# Patient Record
Sex: Male | Born: 1985 | Race: Black or African American | Hispanic: No | Marital: Single | Smoking: Never smoker
Health system: Southern US, Community
[De-identification: ages and names within clinical notes are randomized; demographics above are authoritative.]

## PROBLEM LIST (undated history)

## (undated) DIAGNOSIS — I10 Essential (primary) hypertension: Secondary | ICD-10-CM

---

## 2016-08-26 ENCOUNTER — Encounter (HOSPITAL_COMMUNITY): Payer: Self-pay | Admitting: Emergency Medicine

## 2016-08-26 ENCOUNTER — Ambulatory Visit (HOSPITAL_COMMUNITY)
Admission: EM | Admit: 2016-08-26 | Discharge: 2016-08-26 | Disposition: A | Discharge status: Home / Self Care | Payer: 59 | Attending: Family Medicine | Admitting: Family Medicine

## 2016-08-26 DIAGNOSIS — M778 Other enthesopathies, not elsewhere classified: Secondary | ICD-10-CM | POA: Diagnosis not present

## 2016-08-26 DIAGNOSIS — G5602 Carpal tunnel syndrome, left upper limb: Secondary | ICD-10-CM

## 2016-08-26 NOTE — ED Triage Notes (Signed)
Here for left hand/fingers numbness and swelling onset 3 days  Denies inj/trauma, pain  A&O x4... NAD

## 2016-08-26 NOTE — ED Provider Notes (Signed)
CSN: [id number]     Arrival date & time 08/26/16  1237 History   First MD Initiated Contact with Patient 08/26/16 1252     Chief Complaint  Patient presents with  . 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 This started about 3-4 days ago. He states his job entails grasping several objects such as the handle of a cart, Brooms, mops and other devices for cleaning.      History reviewed. No pertinent past medical history. History reviewed. No pertinent surgical history. History reviewed. No pertinent family history. Social History  Substance Use Topics  . Smoking status: Never Smoker  . Smokeless tobacco: Never Used  . Alcohol use No    Review of Systems  Constitutional: Negative.   HENT: Negative.   Respiratory: Negative.   Gastrointestinal: Negative.   Genitourinary: Negative.   Musculoskeletal:       As per HPI Points to the tips of his second third and fifth digits as to the location of intermittent numbness.  Skin: Negative.   Neurological: Positive for numbness. Negative for dizziness, weakness and headaches.  All other systems reviewed and are negative.   Allergies  Patient has no known allergies.  Home Medications   Prior to Admission medications   Not on File   Meds Ordered and Administered this Visit  Medications - No data to display  BP 135/80 (BP Location: Left Arm)   Pulse 93   Temp 98.4 F (36.9 C) (Oral)   Resp 18   SpO2 97%  No data found.   Physical Exam  Constitutional: He is oriented to person, place, and time. He appears well-developed and well-nourished.  HENT:  Head: Normocephalic and atraumatic.  Eyes: EOM are normal. Left eye exhibits no discharge.  Neck: Neck supple.  Cardiovascular:  Normal rate.   Pulmonary/Chest: Effort normal.  Musculoskeletal: Normal range of motion. He exhibits no edema or deformity.  Slight decrease and sensitivity to the pads of the second third and fourth digits. Strength and grip is 5 over 5. Full thumb opposition. Wrist extension and flexion are fully intact. Negative Tinel's, positive Phalen's. No observable edema. Distal neurovascular motor sensory otherwise grossly  intact  Neurological: He is alert and oriented to person, place, and time. No cranial nerve deficit.  Skin: Skin is warm and dry. Capillary refill takes less than 2 seconds. No erythema.  Psychiatric: He has a normal mood and affect.  Nursing note and vitals reviewed.   Urgent Care Course     Procedures (including critical care time)  Labs Review Labs Reviewed - No data to display  Imaging Review No results found.   Visual Acuity Review  Right Eye Distance:   Left Eye Distance:   Bilateral Distance:    Right Eye Near:   Left Eye Near:    Bilateral Near:         MDM   1. 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 May remove it for washing hands, etc. May also remove it at home but do not perform any work with your hand/wrist without using the wrist. Recommend using it for at least 2 weeks. Apply cold compresses after work, and for work if possible. May take ibuprofen if  you develop aggravating discomfort. If you are not improving in 2-3 weeks consider calling the hand surgeon listed on this page for appointment.     Hayden Rasmussen, NP 08/26/16 1314

## 2016-08-26 NOTE — Discharge Instructions (Signed)
Wear the wrist splint while working at all times. May remove it for washing hands, etc. May also remove it at home but do not perform any work with your hand/wrist without using the wrist. Recommend using it for at least 2 weeks. Apply cold compresses after work, and for work if possible. May take ibuprofen if you develop aggravating discomfort. If you are not improving in 2-3 weeks consider calling the hand surgeon listed on this page for appointment.

## 2018-07-26 ENCOUNTER — Encounter (HOSPITAL_COMMUNITY): Payer: Self-pay | Admitting: Emergency Medicine

## 2018-07-26 ENCOUNTER — Ambulatory Visit (HOSPITAL_COMMUNITY)
Admission: EM | Admit: 2018-07-26 | Discharge: 2018-07-26 | Disposition: A | Discharge status: Home / Self Care | Payer: Self-pay | Attending: Family Medicine | Admitting: Family Medicine

## 2018-07-26 DIAGNOSIS — B9789 Other viral agents as the cause of diseases classified elsewhere: Secondary | ICD-10-CM

## 2018-07-26 DIAGNOSIS — J069 Acute upper respiratory infection, unspecified: Secondary | ICD-10-CM

## 2018-07-26 NOTE — ED Triage Notes (Signed)
Pt c/o cold symptoms x5 days, coughing sneezing, runny nose.

## 2018-07-26 NOTE — ED Provider Notes (Signed)
  [facility]   [id number] 07/26/18 Arrival Time: 1519  ASSESSMENT & PLAN:  1. Viral URI with cough    Prefers OTC cold medications. Declines Rx cough medication. Discussed typical duration of symptoms. OTC symptom care as needed. Ensure adequate fluid intake and rest. May f/u with PCP or here as needed.  Reviewed expectations re: course of current medical issues. Questions answered. Outlined signs and symptoms indicating need for more acute intervention. Patient verbalized understanding. After Visit Summary given.   SUBJECTIVE: History from: patient.  Louis King is a 33 y.o. male who presents with complaint of nasal congestion, post-nasal drainage, sinus pressure, and a persistent dry cough; without sore throat. Onset abrupt, 4-5 d ago; with fatigue and with body aches. SOB: none. Wheezing: none. Fever: no. Overall normal PO intake without n/v. Known sick contacts: no. No specific or significant aggravating or alleviating factors reported. OTC treatment: anti-histamine with mild relief.    Social History   Tobacco Use  Smoking Status Never Smoker  Smokeless Tobacco Never Used    ROS: As per HPI.   OBJECTIVE:  Vitals:   07/26/18 1605  BP: 134/89  Pulse: 90  Resp: 16  Temp: 98.2 F (36.8 C)  SpO2: 97%     General appearance: alert; appears fatigued HEENT: nasal congestion; clear runny nose; throat irritation secondary to post-nasal drainage Neck: supple without LAD CV: RRR Lungs: unlabored respirations, symmetrical air entry without wheezing; cough: mild Abd: soft Ext: no LE edema Skin: warm and dry Psychological: alert and cooperative; normal mood and affect    No Known Allergies   Social History   Socioeconomic History  . Marital status: Single    Spouse name: Not on file  . Number of children: Not on file  . Years of education: Not on file  . Highest education level: Not on file  Occupational History  . Not on file  Social Needs   . Financial resource strain: Not on file  . Food insecurity:    Worry: Not on file    Inability: Not on file  . Transportation needs:    Medical: Not on file    Non-medical: Not on file  Tobacco Use  . Smoking status: Never Smoker  . Smokeless tobacco: Never Used  Substance and Sexual Activity  . Alcohol use: No  . Drug use: Not on file  . Sexual activity: Not on file  Lifestyle  . Physical activity:    Days per week: Not on file    Minutes per session: Not on file  . Stress: Not on file  Relationships  . Social connections:    Talks on phone: Not on file    Gets together: Not on file    Attends religious service: Not on file    Active member of club or organization: Not on file    Attends meetings of clubs or organizations: Not on file    Relationship status: Not on file  . Intimate partner violence:    Fear of current or ex partner: Not on file    Emotionally abused: Not on file    Physically abused: Not on file    Forced sexual activity: Not on file  Other Topics Concern  . Not on file  Social History Narrative  . Not on file           Mardella Layman, MD 07/31/18 [phone]

## 2019-04-22 ENCOUNTER — Encounter (HOSPITAL_COMMUNITY): Payer: Self-pay | Admitting: Emergency Medicine

## 2019-04-22 ENCOUNTER — Emergency Department (HOSPITAL_COMMUNITY)
Admission: EM | Admit: 2019-04-22 | Discharge: 2019-04-23 | Disposition: A | Discharge status: Home / Self Care | Payer: BC Managed Care – PPO | Attending: Emergency Medicine | Admitting: Emergency Medicine

## 2019-04-22 ENCOUNTER — Other Ambulatory Visit: Payer: Self-pay

## 2019-04-22 DIAGNOSIS — Z20828 Contact with and (suspected) exposure to other viral communicable diseases: Secondary | ICD-10-CM | POA: Diagnosis not present

## 2019-04-22 DIAGNOSIS — R1031 Right lower quadrant pain: Secondary | ICD-10-CM | POA: Diagnosis present

## 2019-04-22 DIAGNOSIS — R109 Unspecified abdominal pain: Secondary | ICD-10-CM

## 2019-04-22 NOTE — ED Triage Notes (Signed)
Patient reports right flank pain radiating to right lateral abdomen onset Thursday this week , denies injury , no hematuria or dysuria , patient stated heavy lifting at work . Pain increases when sneezing or coughing .

## 2019-04-23 ENCOUNTER — Emergency Department (HOSPITAL_COMMUNITY): Payer: BC Managed Care – PPO

## 2019-04-23 LAB — COMPREHENSIVE METABOLIC PANEL
ALT: 34 U/L (ref 0–44)
AST: 20 U/L (ref 15–41)
Albumin: 3.8 g/dL (ref 3.5–5.0)
Alkaline Phosphatase: 99 U/L (ref 38–126)
Anion gap: 8 (ref 5–15)
BUN: 10 mg/dL (ref 6–20)
CO2: 27 mmol/L (ref 22–32)
Calcium: 9.2 mg/dL (ref 8.9–10.3)
Chloride: 101 mmol/L (ref 98–111)
Creatinine, Ser: 1.14 mg/dL (ref 0.61–1.24)
GFR calc Af Amer: >60 mL/min (ref >60)
GFR calc non Af Amer: >60 mL/min (ref >60)
Glucose, Bld: 126 mg/dL — ABNORMAL HIGH (ref 70–99)
Potassium: 4 mmol/L (ref 3.5–5.1)
Sodium: 136 mmol/L (ref 135–145)
Total Bilirubin: 0.1 mg/dL — ABNORMAL LOW (ref 0.3–1.2)
Total Protein: 7.2 g/dL (ref 6.5–8.1)

## 2019-04-23 LAB — URINALYSIS, ROUTINE W REFLEX MICROSCOPIC
Bacteria, UA: NONE SEEN
Bilirubin Urine: NEGATIVE
Glucose, UA: NEGATIVE mg/dL
Hgb urine dipstick: NEGATIVE
Ketones, ur: NEGATIVE mg/dL
Leukocytes,Ua: NEGATIVE
Nitrite: NEGATIVE
Protein, ur: 30 mg/dL — AB
Specific Gravity, Urine: 1.026 (ref 1.005–1.030)
pH: 5 (ref 5.0–8.0)

## 2019-04-23 LAB — CBC WITH DIFFERENTIAL/PLATELET
Abs Immature Granulocytes: 0.02 10*3/uL (ref 0.00–0.07)
Basophils Absolute: 0 10*3/uL (ref 0.0–0.1)
Basophils Relative: 0 %
Eosinophils Absolute: 0.1 10*3/uL (ref 0.0–0.5)
Eosinophils Relative: 1 %
HCT: 41.1 % (ref 39.0–52.0)
Hemoglobin: 13.1 g/dL (ref 13.0–17.0)
Immature Granulocytes: 0 %
Lymphocytes Relative: 26 %
Lymphs Abs: 2.3 10*3/uL (ref 0.7–4.0)
MCH: 26.2 pg (ref 26.0–34.0)
MCHC: 31.9 g/dL (ref 30.0–36.0)
MCV: 82.2 fL (ref 80.0–100.0)
Monocytes Absolute: 0.6 10*3/uL (ref 0.1–1.0)
Monocytes Relative: 7 %
Neutro Abs: 5.6 10*3/uL (ref 1.7–7.7)
Neutrophils Relative %: 66 %
Platelets: 360 10*3/uL (ref 150–400)
RBC: 5 MIL/uL (ref 4.22–5.81)
RDW: 14.6 % (ref 11.5–15.5)
WBC: 8.6 10*3/uL (ref 4.0–10.5)
nRBC: 0 % (ref 0.0–0.2)

## 2019-04-23 LAB — SARS CORONAVIRUS 2 (TAT 6-24 HRS): SARS Coronavirus 2: NEGATIVE

## 2019-04-23 LAB — POC SARS CORONAVIRUS 2 AG -  ED: SARS Coronavirus 2 Ag: NEGATIVE

## 2019-04-23 LAB — D-DIMER, QUANTITATIVE: D-Dimer, Quant: 0.59 ug/mL-FEU — ABNORMAL HIGH (ref 0.00–0.50)

## 2019-04-23 MED ORDER — KETOROLAC TROMETHAMINE 30 MG/ML IJ SOLN
30.0000 mg | Freq: Once | INTRAMUSCULAR | Status: AC
  Administered 2019-04-23: 30 mg via INTRAVENOUS
  Filled 2019-04-23: qty 1

## 2019-04-23 MED ORDER — IOHEXOL 350 MG/ML SOLN
100.0000 mL | Freq: Once | INTRAVENOUS | Status: AC
  Administered 2019-04-23: 100 mL via INTRAVENOUS

## 2019-04-23 MED ORDER — IBUPROFEN 800 MG PO TABS: 800.0000 mg | ORAL_TABLET | Freq: Three times a day (TID) | ORAL | 0 refills | Status: DC | PRN

## 2019-04-23 NOTE — ED Notes (Signed)
Pt was holding an SPO2 of 90-92 percent on room air. Pt was advising feeling fine but had not been able to speak in full sentences. Pt pulse noted to be 66 BPM manual.

## 2019-04-23 NOTE — Discharge Instructions (Addendum)
You may alternate Tylenol 1000 mg every 6 hours as needed for pain, fever and Ibuprofen 800 mg every 8 hours as needed for pain, fever.  Please take Ibuprofen with food.  Do not take more than 4000 mg of Tylenol (acetaminophen) in a 24 hour period.    Steps to find a Primary Care Provider (PCP):  Call [phone] or [phone] to access "[facility] Find a Doctor Service."  2.  You may also go on the [facility] website at [email or web address]  3.  [facility] also frequently accepts new patients.  [facility]  [address] [place] [address] [phone]  4.  There are also multiple [facility], [facility] and [facility] practices throughout the [place] that are frequently accepting new patients. You may find a clinic that is close to your home and contact them.  [email or web address] [phone]  [facility] [email or web address]  [email or web address] [phone]  [email or web address] [phone]  5.  Local Health Departments also can provide primary care services.  [facility]  [address] [place] [address] [phone]  [facility] [address] [place] [address] [phone]  [facility] [address]  [place] [address] [phone]   

## 2019-04-23 NOTE — ED Notes (Signed)
Patient transported to CT 

## 2019-04-23 NOTE — ED Provider Notes (Signed)
I received the patient in signout from Dr. Leonides Schanz.  Briefly the patient is a 33 year old male with flank pain.  He felt this was similar to prior kidney stone that he had had.  He was noted to be hypoxic transiently in the ED.  Was ambulatory without hypoxia.  And plan for CT angiogram of the chest evaluate for pulmonary embolism and stone study.  Both of these returned and are negative.  The patient does have some concern for atherosclerosis.  I discussed this with the patient.  We will have him follow-up with a PCP.  Return precautions given.   Deno Etienne, DO 04/23/19 [phone]

## 2019-04-23 NOTE — ED Provider Notes (Signed)
TIME SEEN: 4:56 AM  CHIEF COMPLAINT: Right flank pain  HPI: Patient is a 33 year old male with history of obesity who presents to the emergency department with complaints of several days of right flank pain.  Pain started after he was lifting a heavy box at work and is worse with coughing, sneezing, having a bowel movement, twisting, turning, bending.  He was seen at urgent care who thought this was musculoskeletal and started him on prednisone and cyclobenzaprine.  States he took 1 dose of cyclobenzaprine and felt "loopy" so he stopped this medication.  He denies any history of kidney stones but states tonight pain significantly worsened where he thought he was going to pass out.  States he became diaphoretic and thought he was going to throw up.  He had nausea but no vomiting.  He has taken Tylenol once.  No Ibuprofen.  In the ED it was noted that patient is intermittently hypoxic with sats in the 80s.  He is sitting upright, awake and talking during these episodes.  He denies chest pain or shortness of breath.  No history of PE or DVT.  No lower extremity swelling or pain.  No fevers or cough.  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  No known Covid exposures.  ROS: See HPI Constitutional: no fever  Eyes: no drainage  ENT: no runny nose   Cardiovascular:  no chest pain  Resp: no SOB  GI: no vomiting GU: no dysuria Integumentary: no rash  Allergy: no hives  Musculoskeletal: no leg swelling  Neurological: no slurred speech ROS otherwise negative  PAST MEDICAL HISTORY/PAST SURGICAL HISTORY:  History reviewed. No pertinent past medical history.  MEDICATIONS:  Prior to Admission medications   Medication Sig Start Date End Date Taking? Authorizing Provider  acetaminophen (TYLENOL) 650 MG CR tablet Take 650 mg by mouth every 8 (eight) hours as needed for pain.   Yes [provider]  cyclobenzaprine (FLEXERIL) 10 MG tablet Take 10 mg by mouth at bedtime. 04/19/19   [provider]  predniSONE (DELTASONE) 20 MG tablet Take 40 mg by mouth daily. 04/19/19   [provider]    ALLERGIES:  No Known Allergies  SOCIAL HISTORY:  Social History   Tobacco Use  . Smoking status: Never Smoker  . Smokeless tobacco: Never Used  Substance Use Topics  . Alcohol use: No    FAMILY HISTORY: No family history on file.  EXAM: BP (!) 155/98 (BP Location: Left Arm)   Pulse 88   Temp 98.4 F (36.9 C) (Oral)   Resp 16   SpO2 95%  CONSTITUTIONAL: Alert and oriented and responds appropriately to questions. Well-appearing; well-nourished HEAD: Normocephalic EYES: Conjunctivae clear, pupils appear equal, EOM appear intact ENT: normal nose; moist mucous membranes NECK: Supple, normal ROM CARD: RRR; S1 and S2 appreciated; no murmurs, no clicks, no rubs, no gallops RESP: Normal chest excursion without splinting or tachypnea; breath sounds clear and equal bilaterally; no wheezes, no rhonchi, no rales, no hypoxia or respiratory distress, speaking full sentences ABD/GI: Normal bowel sounds; non-distended; soft, non-tender, no rebound, no guarding, no peritoneal signs, no hepatosplenomegaly BACK: No midline spinal tenderness, step-off or deformity.  Tender to palpation over the right lateral ribs without redness, warmth, ecchymosis, swelling, rash. EXT: Normal ROM in all joints; no deformity noted, no edema; no cyanosis SKIN: Normal color for age and race; warm; no rash on exposed skin NEURO: Moves all extremities equally, reports normal sensation diffusely, normal gait PSYCH: The patient's  mood and manner are appropriate.   MEDICAL DECISION MAKING: Patient here with flank pain.  Initially symptoms seem very consistent with musculoskeletal pain but while in the room evaluating the patient it was noted that he had intermittent episodes of hypoxia.  I am concerned now that this could be pneumonia, COVID-19, PE causing this right lower lateral chest/flank pain.  I do not think  this is a kidney stone although this seems to be his biggest concern.  His urine appears normal today without signs of infection or blood.  Will obtain EKG, D-dimer, chest x-ray.  Otherwise labs are unremarkable.  Will provide with Toradol for pain control.  Will swab for COVID 19 as well.  ED PROGRESS: Patient's D-dimer is slightly elevated.  Will obtain CTA of the chest as well as a CT noncontrast of the abdomen pelvis to evaluate for PE as well as kidney stone.  With ambulation, patient's oxygen saturation was in the low 90s.  His Covid antigen swab is negative.  Will send outpatient PCR Covid swab.  Toradol helped significantly with patient's pain.  I recommended alternating Tylenol and Motrin for further pain control.  7:15 AM  Pt's CT scans pending.  Signed out to Dr. Tyrone Nine to follow-up on CT imaging.  I reviewed all nursing notes and pertinent previous records as available.  I have interpreted any EKGs, lab and urine results, imaging (as available).   EKG Interpretation  Date/Time:  Monday April 23 2019 05:42:47 EST Ventricular Rate:  78 PR Interval:    QRS Duration: 110 QT Interval:  389 QTC Calculation: 444 R Axis:   32 Text Interpretation: Sinus rhythm RSR' in V1 or V2, probably normal variant Borderline repolarization abnormality ST elevation, consider anterolateral injury No old tracing to compare Confirmed by Ward, Cyril Mourning [phone]) on 04/23/2019 5:46:51 AM         Louis King was evaluated in Emergency Department on 04/23/2019 for the symptoms described in the history of present illness. He was evaluated in the context of the global COVID-19 pandemic, which necessitated consideration that the patient might be at risk for infection with the SARS-CoV-2 virus that causes COVID-19. Institutional protocols and algorithms that pertain to the evaluation of patients at risk for COVID-19 are in a state of rapid change based on information released by regulatory bodies including the CDC  and federal and state organizations. These policies and algorithms were followed during the patient's care in the ED.  Patient was seen wearing N95, face shield, gloves.    Ward, Delice Bison, DO 04/23/19 [phone]

## 2020-02-12 ENCOUNTER — Ambulatory Visit: Payer: Self-pay | Attending: Internal Medicine

## 2020-02-12 ENCOUNTER — Other Ambulatory Visit (HOSPITAL_BASED_OUTPATIENT_CLINIC_OR_DEPARTMENT_OTHER): Payer: Self-pay | Admitting: Internal Medicine

## 2020-02-12 DIAGNOSIS — Z23 Encounter for immunization: Secondary | ICD-10-CM

## 2020-02-12 NOTE — Progress Notes (Signed)
   Covid-19 Vaccination Clinic  Name:  Louis King    MRN: [id number] DOB: [birth year]  02/12/2020  [name] was observed post Covid-19 immunization for 15 minutes without incident. He was provided with Vaccine Information Sheet and instruction to access the V-Safe system.  Vaccinated by Gladis Riffle  [name] was instructed to call 911 with any severe reactions post vaccine: Marland Kitchen Difficulty breathing  . Swelling of face and throat  . A fast heartbeat  . A bad rash all over body  . Dizziness and weakness   Immunizations Administered    Name Date Dose VIS Date Route   Pfizer COVID-19 Vaccine 02/12/2020 11:51 AM 0.3 mL 07/11/2018 Intramuscular   Manufacturer: [employer], [employer]   Lot: [id number]   NDC: [id number]

## 2020-02-15 MED FILL — PFIZER-BIONTECH COVID-19 VA: 30 | 1 days supply | Qty: 0 | Fill #0

## 2020-03-04 ENCOUNTER — Other Ambulatory Visit (HOSPITAL_BASED_OUTPATIENT_CLINIC_OR_DEPARTMENT_OTHER): Payer: Self-pay | Admitting: Internal Medicine

## 2020-03-04 ENCOUNTER — Ambulatory Visit: Payer: Self-pay | Attending: Internal Medicine

## 2020-03-04 DIAGNOSIS — Z23 Encounter for immunization: Secondary | ICD-10-CM

## 2020-03-04 NOTE — Progress Notes (Signed)
   Covid-19 Vaccination Clinic  Name:  Tashi Band    MRN: [id number] DOB: 11-29-1985  03/04/2020  Mr. Nellums was observed post Covid-19 immunization for 15 minutes without incident. He was provided with Vaccine Information Sheet and instruction to access the V-Safe system.   Mr. Pavlak was instructed to call 911 with any severe reactions post vaccine: Marland Kitchen Difficulty breathing  . Swelling of face and throat  . A fast heartbeat  . A bad rash all over body  . Dizziness and weakness   Immunizations Administered    Name Date Dose VIS Date Route   Pfizer COVID-19 Vaccine 03/04/2020 12:43 PM 0.3 mL 07/11/2018 Intramuscular   Manufacturer: [employer], [employer]   Lot: [id number]   NDC: [address]-5

## 2020-03-11 MED FILL — PFIZER-BIONTECH COVID-19 VA: 30 | 1 days supply | Qty: 0 | Fill #0

## 2021-10-21 IMAGING — CT CT RENAL STONE PROTOCOL
2 of 4 series · 17 of 46 positions shown, 19 images · non-contrast
Comparison: None.

CLINICAL DATA: Right lateral abdominal pain. Felt something pop on
[REDACTED] with worsening.

EXAM:
CT ABDOMEN AND PELVIS WITHOUT CONTRAST
TECHNIQUE: Multidetector CT imaging of the abdomen and pelvis was performed
following the standard protocol without IV contrast.

[Series 5: ap without · axial · non-contrast · 0.96mm/px · z∈[-497,-77]mm · 14 of 96 slices shown, 16 images]
[im 6/96  soft-tissue]
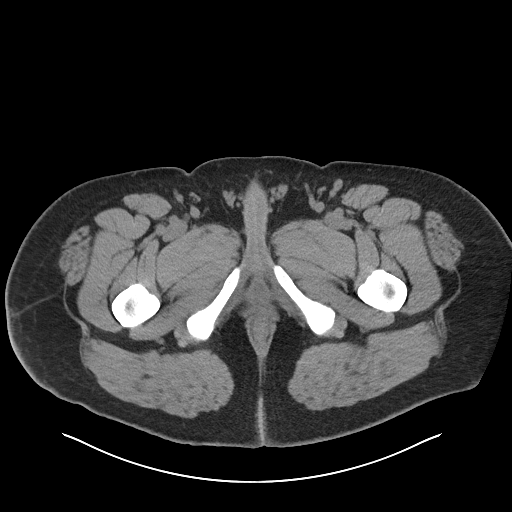
[im 6/96  bone]
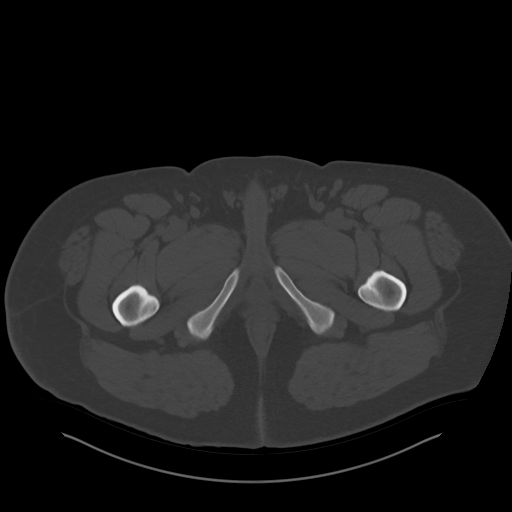
[im 12/96  soft-tissue]
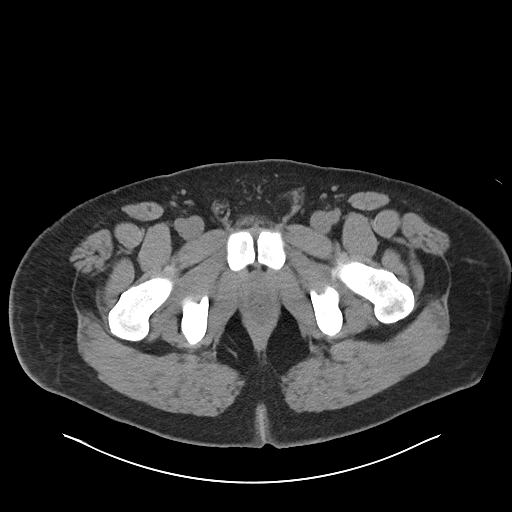
[im 17/96  soft-tissue]
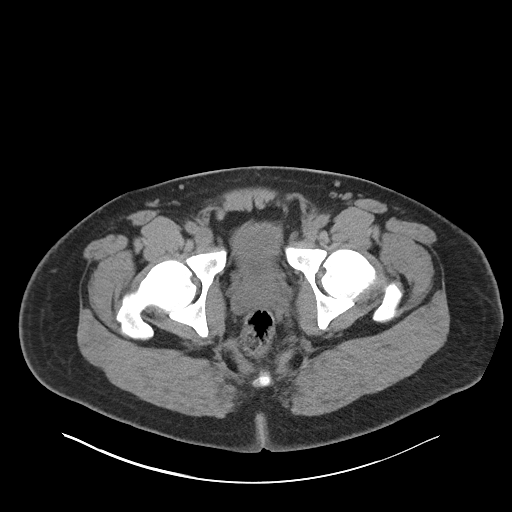
[im 28/96  soft-tissue]
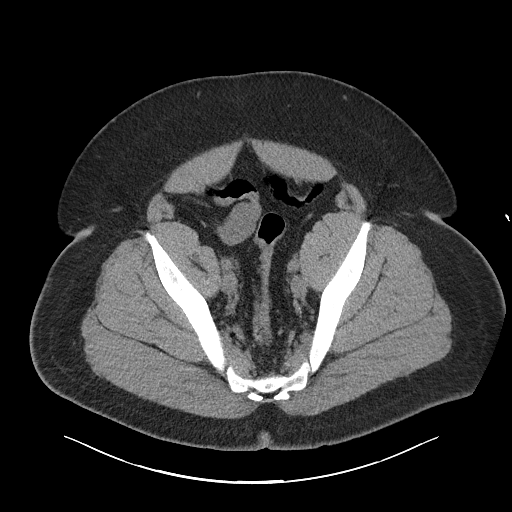
[im 34/96  soft-tissue]
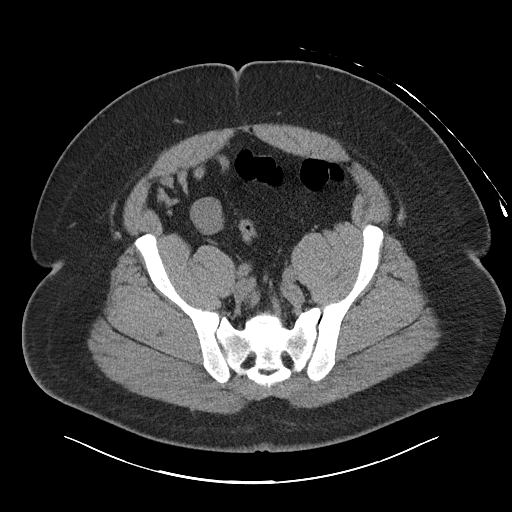
[im 40/96  soft-tissue]
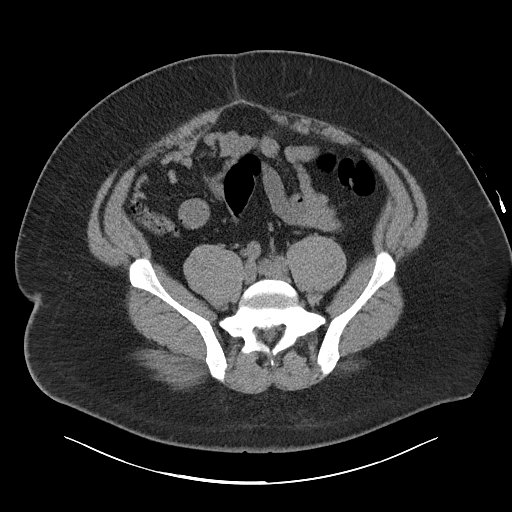
[im 45/96  soft-tissue]
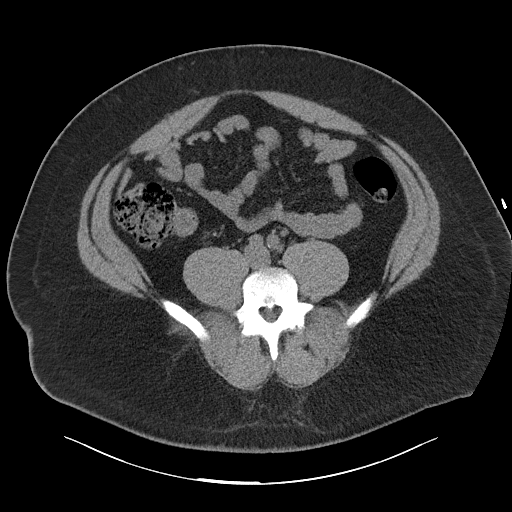
[im 51/96  soft-tissue]
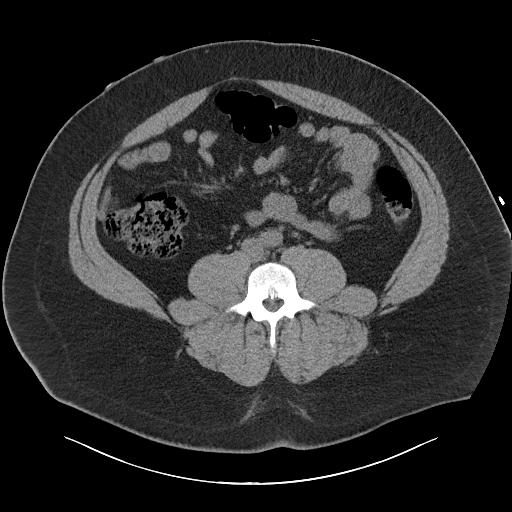
[im 56/96  soft-tissue]
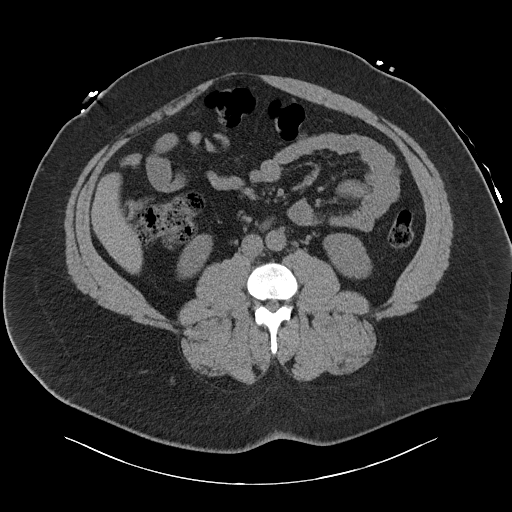
[im 56/96  bone]
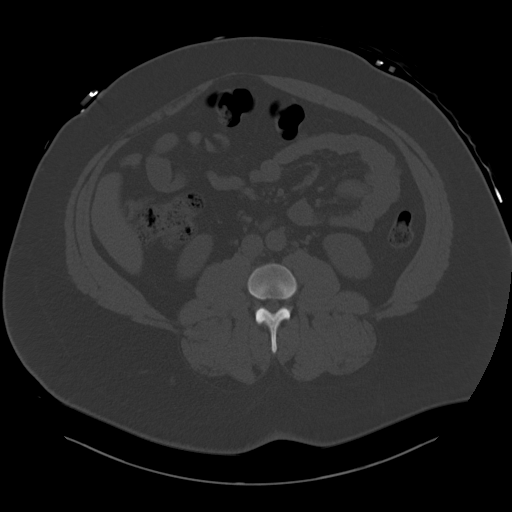
[im 62/96  soft-tissue]
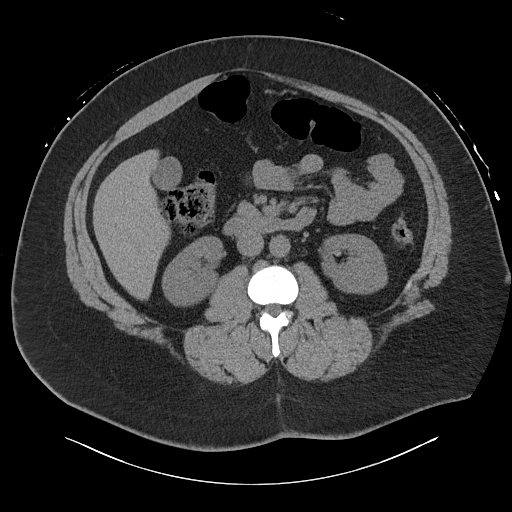
[im 73/96  soft-tissue]
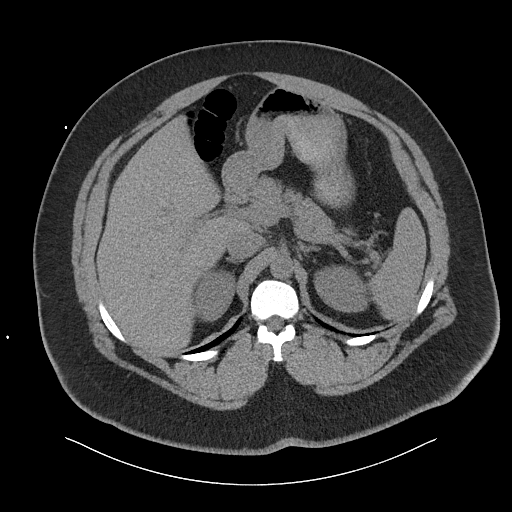
[im 79/96  soft-tissue]
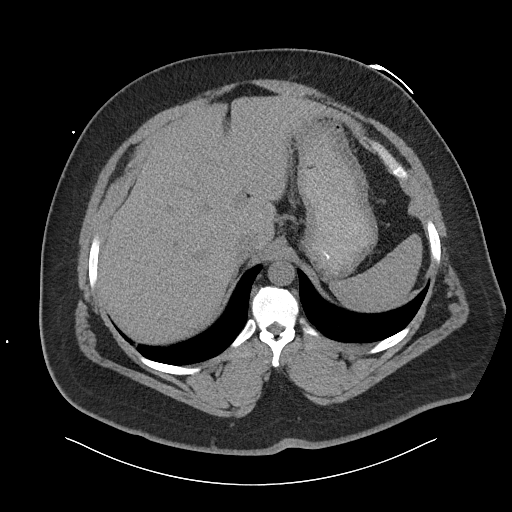
[im 84/96  soft-tissue]
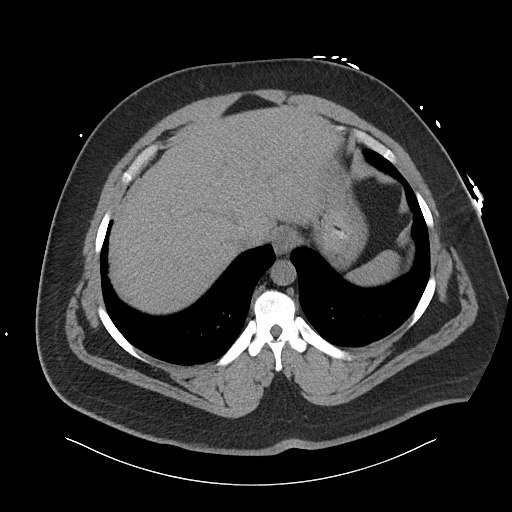
[im 90/96  soft-tissue]
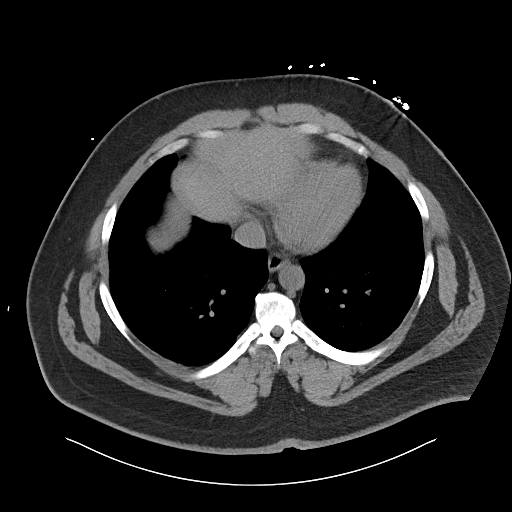

[Series 8: cor · coronal · 0.97mm/px · 3 of 144 slices shown]
[im 48/144  soft-tissue]
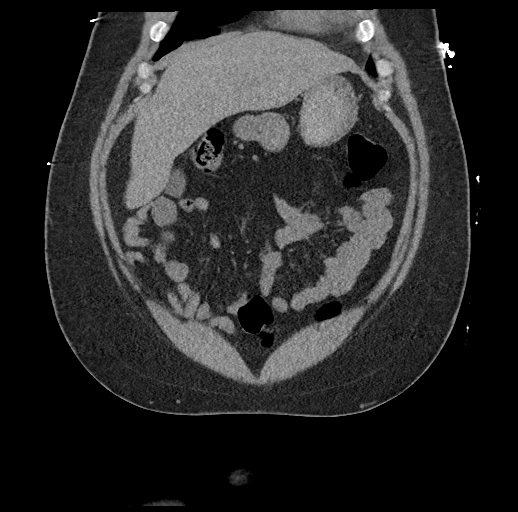
[im 64/144  soft-tissue]
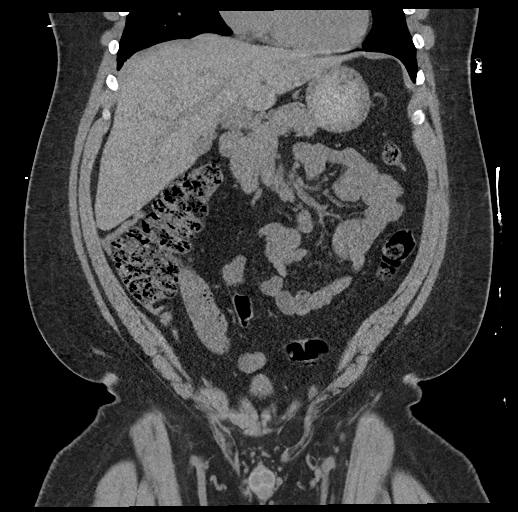
[im 80/144  soft-tissue]
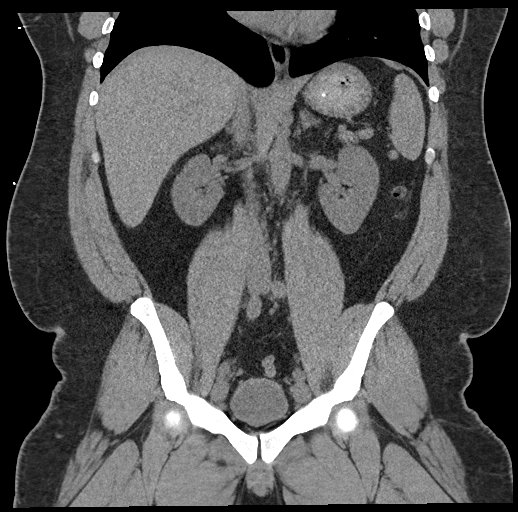

[17 of 46 positions shown; findings below may reference images not displayed]

FINDINGS: Lower chest:  No contributory findings.

Hepatobiliary: No focal liver abnormality.No evidence of biliary
obstruction or stone.

Pancreas: Unremarkable.

Spleen: Unremarkable.

Adrenals/Urinary Tract: Negative adrenals. No hydronephrosis or
stone. Unremarkable bladder.

Stomach/Bowel:  No obstruction. No appendicitis.

Vascular/Lymphatic: No acute vascular abnormality. Aortic
atherosclerotic calcification. No mass or adenopathy.

Reproductive:No pathologic findings.

Other: No ascites or pneumoperitoneum. Shallow fatty left inguinal
hernia.

Musculoskeletal: No acute abnormalities. Transitional S1 vertebra.
Advanced L5-S1 osteoarthritis on the left. L4-5 shallow right
paracentral protrusion with mild calcification.
IMPRESSION: 1. No acute finding.  No hydronephrosis or urolithiasis.
2. Atherosclerotic calcification.

## 2023-01-15 ENCOUNTER — Emergency Department (HOSPITAL_COMMUNITY)
Admission: EM | Admit: 2023-01-15 | Discharge: 2023-01-15 | Disposition: A | Discharge status: Home / Self Care | Payer: 59 | Attending: Emergency Medicine | Admitting: Emergency Medicine

## 2023-01-15 ENCOUNTER — Other Ambulatory Visit: Payer: Self-pay

## 2023-01-15 ENCOUNTER — Encounter (HOSPITAL_COMMUNITY): Payer: Self-pay

## 2023-01-15 ENCOUNTER — Emergency Department (HOSPITAL_COMMUNITY): Payer: 59

## 2023-01-15 DIAGNOSIS — F410 Panic disorder [episodic paroxysmal anxiety] without agoraphobia: Secondary | ICD-10-CM

## 2023-01-15 DIAGNOSIS — F419 Anxiety disorder, unspecified: Secondary | ICD-10-CM | POA: Diagnosis not present

## 2023-01-15 DIAGNOSIS — R55 Syncope and collapse: Secondary | ICD-10-CM | POA: Insufficient documentation

## 2023-01-15 LAB — CBC WITH DIFFERENTIAL/PLATELET
Abs Immature Granulocytes: 0.02 10*3/uL (ref 0.00–0.07)
Basophils Absolute: 0 10*3/uL (ref 0.0–0.1)
Basophils Relative: 1 %
Eosinophils Absolute: 0 10*3/uL (ref 0.0–0.5)
Eosinophils Relative: 0 %
HCT: 42.5 % (ref 39.0–52.0)
Hemoglobin: 14 g/dL (ref 13.0–17.0)
Immature Granulocytes: 0 %
Lymphocytes Relative: 21 %
Lymphs Abs: 1.3 10*3/uL (ref 0.7–4.0)
MCH: 27.4 pg (ref 26.0–34.0)
MCHC: 32.9 g/dL (ref 30.0–36.0)
MCV: 83.2 fL (ref 80.0–100.0)
Monocytes Absolute: 0.4 10*3/uL (ref 0.1–1.0)
Monocytes Relative: 5 %
Neutro Abs: 4.7 10*3/uL (ref 1.7–7.7)
Neutrophils Relative %: 73 %
Platelets: 324 10*3/uL (ref 150–400)
RBC: 5.11 MIL/uL (ref 4.22–5.81)
RDW: 14.1 % (ref 11.5–15.5)
WBC: 6.5 10*3/uL (ref 4.0–10.5)
nRBC: 0 % (ref 0.0–0.2)

## 2023-01-15 LAB — BASIC METABOLIC PANEL
Anion gap: 10 (ref 5–15)
BUN: 11 mg/dL (ref 6–20)
CO2: 23 mmol/L (ref 22–32)
Calcium: 9.3 mg/dL (ref 8.9–10.3)
Chloride: 103 mmol/L (ref 98–111)
Creatinine, Ser: 1.27 mg/dL — ABNORMAL HIGH (ref 0.61–1.24)
GFR, Estimated: >60 mL/min (ref >60)
Glucose, Bld: 104 mg/dL — ABNORMAL HIGH (ref 70–99)
Potassium: 3.9 mmol/L (ref 3.5–5.1)
Sodium: 136 mmol/L (ref 135–145)

## 2023-01-15 LAB — TROPONIN I (HIGH SENSITIVITY)
Troponin I (High Sensitivity): 21 ng/L — ABNORMAL HIGH (ref <18)
Troponin I (High Sensitivity): 25 ng/L — ABNORMAL HIGH (ref <18)

## 2023-01-15 MED ORDER — ACETAMINOPHEN 500 MG PO TABS
1000.0000 mg | ORAL_TABLET | Freq: Once | ORAL | Status: AC
  Administered 2023-01-15: 1000 mg via ORAL
  Filled 2023-01-15: qty 2

## 2023-01-15 MED ORDER — LORAZEPAM 1 MG PO TABS
1.0000 mg | ORAL_TABLET | Freq: Once | ORAL | Status: AC
  Administered 2023-01-15: 1 mg via ORAL
  Filled 2023-01-15: qty 1

## 2023-01-15 NOTE — ED Provider Notes (Signed)
[facility] Provider Note   CSN: [id number] Arrival date & time: 01/15/23  1332     History  Chief Complaint  Patient presents with   Panic Attack    Louis King is a 37 y.o. male.  This is a 37 year old male who presents emergency department today after he had a syncopal episode.  Per EMS, the patient was informed that he had a 79 year old son, he then became very anxious, was breathing very quickly, lost consciousness and fell down striking his head.        Home Medications Prior to Admission medications   Medication Sig Start Date End Date Taking? Authorizing Provider  acetaminophen (TYLENOL) 650 MG CR tablet Take 650 mg by mouth 2 (two) times daily as needed for pain.     [provider]  Camphor-Menthol-Methyl Sal (TIGER BALM MUSCLE RUB) 07-19-13 % CREA Apply 1 application topically at bedtime as needed (for muscle pain).    [provider]  cyclobenzaprine (FLEXERIL) 10 MG tablet Take 10 mg by mouth at bedtime. 04/19/19   [provider]  ibuprofen (ADVIL) 800 MG tablet Take 1 tablet (800 mg total) by mouth every 8 (eight) hours as needed for mild pain. 04/23/19   Ward, Layla Maw, DO      Allergies    Flexeril [cyclobenzaprine]    Review of Systems   Review of Systems  Physical Exam Updated Vital Signs BP (!) 148/112   Pulse (!) 102   Temp 97.8 F (36.6 C) (Oral)   Resp (!) 26   SpO2 100%  Physical Exam Constitutional:      Appearance: Normal appearance.  HENT:     Head: Normocephalic and atraumatic.     Right Ear: Tympanic membrane normal.     Left Ear: Tympanic membrane normal.     Nose: Nose normal.     Mouth/Throat:     Mouth: Mucous membranes are moist.  Eyes:     Pupils: Pupils are equal, round, and reactive to light.  Neck:     Comments: Right-sided paraspinal muscle tenderness Cardiovascular:     Pulses: Normal pulses.     Heart sounds: Normal heart sounds.  Pulmonary:      Effort: Pulmonary effort is normal.     Breath sounds: Normal breath sounds.  Abdominal:     Palpations: Abdomen is soft.  Musculoskeletal:        General: Normal range of motion.     Cervical back: Normal range of motion and neck supple. No rigidity.  Skin:    General: Skin is warm and dry.  Neurological:     General: No focal deficit present.     Mental Status: He is alert and oriented to person, place, and time.     Cranial Nerves: No cranial nerve deficit.     Motor: No weakness.     Gait: Gait normal.     ED Results / Procedures / Treatments   Labs (all labs ordered are listed, but only abnormal results are displayed) Labs Reviewed  BASIC METABOLIC PANEL  CBC WITH DIFFERENTIAL/PLATELET  TROPONIN I (HIGH SENSITIVITY)    EKG None  Radiology No results found.  Procedures Procedures    Medications Ordered in ED Medications  LORazepam (ATIVAN) tablet 1 mg (has no administration in time range)    ED Course/ Medical Decision Making/ A&P  Medical Decision Making 37 year old male presents to the emergency department after a syncopal episode.  Differential diagnoses include syncope, less likely vasovagal syncope, panic attack, acute stress reaction.  Plan- sounds like the patient had an acute stress reaction after discovering that he had a 36 year old son.  Patient with a reassuring physical exam.  He is endorsing some chest pain, she will get an EKG and a troponin.  Imaging of the patient's head and neck ordered.  Will provide the patient with some p.o. Ativan.  Reassessment-patient feeling quite a bit better at this time.  His initial troponin was elevated 21, second was obtained that was 25.  This is not a significant delta.  I repeated an EKG on the patient which did not show any changes.  Patient not experiencing chest pain or shortness of breath.  Patient without significant cardiac risk factors.  Will discharge patient  home.  Amount and/or Complexity of Data Reviewed Labs: ordered. Radiology: ordered.  Risk OTC drugs. Prescription drug management.           Final Clinical Impression(s) / ED Diagnoses Final diagnoses:  None    Rx / DC Orders ED Discharge Orders     None         Arletha Pili, DO 01/15/23 2102

## 2023-01-15 NOTE — ED Triage Notes (Signed)
Pt BIB [facility] from home. Pt has really bad anxiety. Pt is not on any medications. Pt found out he has another son with another woman last week after 13 years which triggered his anxiety. Pt passed out from hyperventilating. Hit his head on the floor. Pt c/o R side head pain. No apparent injuries noted. A&O X4.  C-collar applied by EMS.  BP 170/100 HR 120s SPO2 98

## 2023-01-15 NOTE — Discharge Instructions (Signed)
While you are in the emergency department, your EKGs done that were normal.  I believe that your symptoms are experienced today were due to stress.  Return to the emergency department if you develop pain in your chest or difficulty breathing.

## 2023-10-22 ENCOUNTER — Emergency Department (HOSPITAL_COMMUNITY)

## 2023-10-22 ENCOUNTER — Observation Stay (HOSPITAL_COMMUNITY)
Admission: EM | Admit: 2023-10-22 | Discharge: 2023-10-23 | Disposition: A | Discharge status: Home / Self Care | Attending: Cardiovascular Disease | Admitting: Cardiovascular Disease

## 2023-10-22 ENCOUNTER — Encounter (HOSPITAL_COMMUNITY): Payer: Self-pay | Admitting: *Deleted

## 2023-10-22 ENCOUNTER — Other Ambulatory Visit: Payer: Self-pay

## 2023-10-22 DIAGNOSIS — Z6841 Body Mass Index (BMI) 40.0 and over, adult: Secondary | ICD-10-CM | POA: Diagnosis not present

## 2023-10-22 DIAGNOSIS — E785 Hyperlipidemia, unspecified: Secondary | ICD-10-CM | POA: Insufficient documentation

## 2023-10-22 DIAGNOSIS — I351 Nonrheumatic aortic (valve) insufficiency: Secondary | ICD-10-CM | POA: Insufficient documentation

## 2023-10-22 DIAGNOSIS — Z7982 Long term (current) use of aspirin: Secondary | ICD-10-CM | POA: Insufficient documentation

## 2023-10-22 DIAGNOSIS — I340 Nonrheumatic mitral (valve) insufficiency: Secondary | ICD-10-CM | POA: Insufficient documentation

## 2023-10-22 DIAGNOSIS — I361 Nonrheumatic tricuspid (valve) insufficiency: Secondary | ICD-10-CM | POA: Diagnosis not present

## 2023-10-22 DIAGNOSIS — R55 Syncope and collapse: Secondary | ICD-10-CM | POA: Insufficient documentation

## 2023-10-22 DIAGNOSIS — I371 Nonrheumatic pulmonary valve insufficiency: Secondary | ICD-10-CM | POA: Insufficient documentation

## 2023-10-22 DIAGNOSIS — R079 Chest pain, unspecified: Secondary | ICD-10-CM

## 2023-10-22 DIAGNOSIS — I119 Hypertensive heart disease without heart failure: Secondary | ICD-10-CM | POA: Insufficient documentation

## 2023-10-22 DIAGNOSIS — I422 Other hypertrophic cardiomyopathy: Secondary | ICD-10-CM | POA: Insufficient documentation

## 2023-10-22 DIAGNOSIS — I249 Acute ischemic heart disease, unspecified: Secondary | ICD-10-CM | POA: Diagnosis not present

## 2023-10-22 DIAGNOSIS — F419 Anxiety disorder, unspecified: Secondary | ICD-10-CM | POA: Insufficient documentation

## 2023-10-22 HISTORY — DX: Essential (primary) hypertension: I10

## 2023-10-22 LAB — BASIC METABOLIC PANEL WITH GFR
Anion gap: 11 (ref 5–15)
BUN: 12 mg/dL (ref 6–20)
CO2: 22 mmol/L (ref 22–32)
Calcium: 9.5 mg/dL (ref 8.9–10.3)
Chloride: 103 mmol/L (ref 98–111)
Creatinine, Ser: 1.1 mg/dL (ref 0.61–1.24)
GFR, Estimated: >60 mL/min (ref >60)
Glucose, Bld: 103 mg/dL — ABNORMAL HIGH (ref 70–99)
Potassium: 3.8 mmol/L (ref 3.5–5.1)
Sodium: 136 mmol/L (ref 135–145)

## 2023-10-22 LAB — CBC
HCT: 48.1 % (ref 39.0–52.0)
Hemoglobin: 15.3 g/dL (ref 13.0–17.0)
MCH: 26.2 pg (ref 26.0–34.0)
MCHC: 31.8 g/dL (ref 30.0–36.0)
MCV: 82.4 fL (ref 80.0–100.0)
Platelets: 319 10*3/uL (ref 150–400)
RBC: 5.84 MIL/uL — ABNORMAL HIGH (ref 4.22–5.81)
RDW: 14.4 % (ref 11.5–15.5)
WBC: 5.7 10*3/uL (ref 4.0–10.5)
nRBC: 0 % (ref 0.0–0.2)

## 2023-10-22 LAB — TROPONIN I (HIGH SENSITIVITY)
Troponin I (High Sensitivity): 21 ng/L — ABNORMAL HIGH (ref <18)
Troponin I (High Sensitivity): 24 ng/L — ABNORMAL HIGH (ref <18)

## 2023-10-22 MED ORDER — SODIUM CHLORIDE 0.9% FLUSH
3.0000 mL | Freq: Two times a day (BID) | INTRAVENOUS | Status: DC
  Administered 2023-10-22 – 2023-10-23 (×2): 3 mL via INTRAVENOUS

## 2023-10-22 MED ORDER — METOPROLOL SUCCINATE ER 25 MG PO TB24
25.0000 mg | ORAL_TABLET | Freq: Every day | ORAL | Status: DC
  Administered 2023-10-22 – 2023-10-23 (×2): 25 mg via ORAL
  Filled 2023-10-22 (×2): qty 1

## 2023-10-22 MED ORDER — SODIUM CHLORIDE 0.9% FLUSH: 3.0000 mL | INTRAVENOUS | Status: DC | PRN

## 2023-10-22 MED ORDER — ONDANSETRON HCL 4 MG/2ML IJ SOLN: 4.0000 mg | Freq: Four times a day (QID) | INTRAMUSCULAR | Status: DC | PRN

## 2023-10-22 MED ORDER — ASPIRIN 81 MG PO TBEC
81.0000 mg | DELAYED_RELEASE_TABLET | Freq: Every day | ORAL | Status: DC
  Administered 2023-10-23: 81 mg via ORAL
  Filled 2023-10-22: qty 1

## 2023-10-22 MED ORDER — SODIUM CHLORIDE 0.9 % IV SOLN: 250.0000 mL | INTRAVENOUS | Status: DC | PRN

## 2023-10-22 MED ORDER — HEPARIN BOLUS VIA INFUSION
4000.0000 [IU] | Freq: Once | INTRAVENOUS | Status: AC
  Administered 2023-10-22: 4000 [IU] via INTRAVENOUS
  Filled 2023-10-22: qty 4000

## 2023-10-22 MED ORDER — ACETAMINOPHEN 325 MG PO TABS
650.0000 mg | ORAL_TABLET | ORAL | Status: DC | PRN
Start: 2023-10-22 — End: 2023-10-23
  Administered 2023-10-22: 650 mg via ORAL
  Filled 2023-10-22: qty 2

## 2023-10-22 MED ORDER — LOSARTAN POTASSIUM 50 MG PO TABS
25.0000 mg | ORAL_TABLET | Freq: Every day | ORAL | Status: DC
  Administered 2023-10-22 – 2023-10-23 (×2): 25 mg via ORAL
  Filled 2023-10-22 (×2): qty 1

## 2023-10-22 MED ORDER — ASPIRIN 300 MG RE SUPP: 300.0000 mg | RECTAL | Status: AC

## 2023-10-22 MED ORDER — NITROGLYCERIN 0.4 MG SL SUBL: 0.4000 mg | SUBLINGUAL_TABLET | SUBLINGUAL | Status: DC | PRN

## 2023-10-22 MED ORDER — IOHEXOL 350 MG/ML SOLN
75.0000 mL | Freq: Once | INTRAVENOUS | Status: AC | PRN
  Administered 2023-10-22: 75 mL via INTRAVENOUS

## 2023-10-22 MED ORDER — ATORVASTATIN CALCIUM 40 MG PO TABS
40.0000 mg | ORAL_TABLET | Freq: Every day | ORAL | Status: DC
Start: 2023-10-22 — End: 2023-10-23
  Administered 2023-10-22 – 2023-10-23 (×2): 40 mg via ORAL
  Filled 2023-10-22 (×2): qty 1

## 2023-10-22 MED ORDER — HEPARIN (PORCINE) 25000 UT/250ML-% IV SOLN
1750.0000 [IU]/h | INTRAVENOUS | Status: DC
  Administered 2023-10-22: 1500 [IU]/h via INTRAVENOUS
  Administered 2023-10-23: 1700 [IU]/h via INTRAVENOUS
  Filled 2023-10-22 (×2): qty 250

## 2023-10-22 MED ORDER — ASPIRIN 81 MG PO CHEW
324.0000 mg | CHEWABLE_TABLET | ORAL | Status: AC
  Administered 2023-10-22: 324 mg via ORAL
  Filled 2023-10-22: qty 4

## 2023-10-22 NOTE — Progress Notes (Signed)
 ANTICOAGULATION CONSULT NOTE  Pharmacy Consult for Heparin Indication: chest pain/ACS  Allergies  Allergen Reactions   Flexeril [Cyclobenzaprine] Other (See Comments)    Caused lethargy    Patient Measurements: Height: 5\' 11"  (180.3 cm) Weight: (!) 147.4 kg (325 lb) IBW/kg (Calculated) : 75.3 Heparin Dosing Weight: 110.1 kg  Vital Signs: Temp: 99.1 F (37.3 C) (06/07 1525) BP: 147/80 (06/07 1800) Pulse Rate: 86 (06/07 1730)  Labs: Recent Labs    10/22/23 1552 10/22/23 1823  HGB 15.3  --   HCT 48.1  --   PLT 319  --   CREATININE 1.10  --   TROPONINIHS 21* 24*    Estimated Creatinine Clearance: 134.1 mL/min (by C-G formula based on SCr of 1.1 mg/dL).   Medical History: Past Medical History:  Diagnosis Date   Hypertension     Medications:  (Not in a hospital admission)  Scheduled:   aspirin  324 mg Oral NOW   Or   aspirin  300 mg Rectal NOW   [START ON 10/23/2023] aspirin EC  81 mg Oral Daily   atorvastatin  40 mg Oral Daily   losartan  25 mg Oral Daily   metoprolol succinate  25 mg Oral Daily   sodium chloride flush  3 mL Intravenous Q12H   Infusions:   sodium chloride     PRN: sodium chloride, acetaminophen , nitroGLYCERIN, ondansetron (ZOFRAN) IV, sodium chloride flush  Assessment: 37 yom with a history of HTN, obesity. Patient is presenting with chest pain. Heparin per pharmacy consult placed for chest pain/ACS.  Patient is not on anticoagulation prior to arrival.  Hgb 15.3; plt 319  Goal of Therapy:  Heparin level 0.3-0.7 units/ml Monitor platelets by anticoagulation protocol: Yes   Plan:  Give IV heparin 4000 units bolus x 1 Start heparin infusion at 1500 units/hr Check anti-Xa level in 6 hours and daily while on heparin Continue to monitor H&H and platelets  Dionicio Fray, PharmD, BCPS 10/22/2023 8:20 PM ED Clinical Pharmacist -  [phone]

## 2023-10-22 NOTE — H&P (Signed)
 Referring Physician: Celesta Coke, MD/Christian Legrand Puma, PA-C  Louis King is an 38 y.o. male.                       Chief Complaint: Chest pain and syncope  HPI: 38 years old black male with PMH of untreated Hypertension (known to the patient for at least 2 years) and Morbid Obesity has episodes of chest pain, shortness of breath, nausea and one episode of syncope. He admits to heavy duty work x 1 week. His EKG shows sinus rhythm with LVH. Troponin I levels are minimally elevated x 2. CBC and BMET are near normal. CXR is unremarkable. CT of abdomen is without acute abnormality.  Past Medical History:  Diagnosis Date   Hypertension       History reviewed. No pertinent surgical history.  History reviewed. No pertinent family history. Social History:  reports that he has never smoked. He has never used smokeless tobacco. He reports that he does not drink alcohol and does not use drugs.  Allergies:  Allergies  Allergen Reactions   Flexeril [Cyclobenzaprine] Other (See Comments)    Caused lethargy    (Not in a hospital admission)   Results for orders placed or performed during the hospital encounter of 10/22/23 (from the past 48 hours)  Basic metabolic panel     Status: Abnormal   Collection Time: 10/22/23  3:52 PM  Result Value Ref Range   Sodium 136 135 - 145 mmol/L   Potassium 3.8 3.5 - 5.1 mmol/L   Chloride 103 98 - 111 mmol/L   CO2 22 22 - 32 mmol/L   Glucose, Bld 103 (H) 70 - 99 mg/dL    Comment: Glucose reference range applies only to samples taken after fasting for at least 8 hours.   BUN 12 6 - 20 mg/dL   Creatinine, Ser 1.61 0.61 - 1.24 mg/dL   Calcium 9.5 8.9 - 10.3 mg/dL   GFR, Estimated >09 >60 mL/min    Comment: (NOTE) Calculated using the CKD-EPI Creatinine Equation (2021)    Anion gap 11 5 - 15    Comment: Performed at [facility], [address] [place], [place] [address]  CBC     Status: Abnormal   Collection Time: 10/22/23  3:52 PM   Result Value Ref Range   WBC 5.7 4.0 - 10.5 K/uL   RBC 5.84 (H) 4.22 - 5.81 MIL/uL   Hemoglobin 15.3 13.0 - 17.0 g/dL   HCT 81.1 91.4 - 78.2 %   MCV 82.4 80.0 - 100.0 fL   MCH 26.2 26.0 - 34.0 pg   MCHC 31.8 30.0 - 36.0 g/dL   RDW 95.6 21.3 - 08.6 %   Platelets 319 150 - 400 K/uL   nRBC 0.0 0.0 - 0.2 %    Comment: Performed at [facility], [address], [place], [place] [address]  Troponin I (High Sensitivity)     Status: Abnormal   Collection Time: 10/22/23  3:52 PM  Result Value Ref Range   Troponin I (High Sensitivity) 21 (H) <18 ng/L    Comment: (NOTE) Elevated high sensitivity troponin I (hsTnI) values and significant  changes across serial measurements may suggest ACS but many other  chronic and acute conditions are known to elevate hsTnI results.  Refer to the "Links" section for chest pain algorithms and additional  guidance. Performed at [facility], [address] [place], [place] [address]   Troponin I (High  Sensitivity)     Status: Abnormal   Collection Time: 10/22/23  6:23 PM  Result Value Ref Range   Troponin I (High Sensitivity) 24 (H) <18 ng/L    Comment: (NOTE) Elevated high sensitivity troponin I (hsTnI) values and significant  changes across serial measurements may suggest ACS but many other  chronic and acute conditions are known to elevate hsTnI results.  Refer to the "Links" section for chest pain algorithms and additional  guidance. Performed at [facility], [address], [place], [place] [address]    CT ABDOMEN PELVIS W CONTRAST Result Date: 10/22/2023 CLINICAL DATA:  Right lower quadrant abdominal pain EXAM: CT ABDOMEN AND PELVIS WITH CONTRAST TECHNIQUE: Multidetector CT imaging of the abdomen and pelvis was performed using the standard protocol following bolus administration of intravenous contrast. RADIATION DOSE REDUCTION: This exam was performed according to the departmental dose-optimization program which includes  automated exposure control, adjustment of the mA and/or kV according to patient size and/or use of iterative reconstruction technique. CONTRAST:  75mL OMNIPAQUE  IOHEXOL  350 MG/ML SOLN COMPARISON:  CT 04/22/2021 FINDINGS: Lower chest: No acute abnormality. Hepatobiliary: Unremarkable liver. Normal gallbladder. No biliary dilation. Pancreas: Unremarkable. Spleen: Unremarkable. Adrenals/Urinary Tract: Normal adrenal glands. No urinary calculi or hydronephrosis. Bladder is unremarkable. Stomach/Bowel: Normal caliber large and small bowel. No bowel wall thickening. The appendix is normal.Stomach is within normal limits. Vascular/Lymphatic: No significant vascular findings are present. No enlarged abdominal or pelvic lymph nodes. Reproductive: Unremarkable. Other: No free intraperitoneal fluid or air. Left greater than right fat containing inguinal hernias. Musculoskeletal: No acute fracture. IMPRESSION: 1. No acute abnormality in the abdomen or pelvis. Normal appendix. 2. Left greater than right fat containing inguinal hernias. Electronically Signed   By: Rozell Cornet M.D.   On: 10/22/2023 18:58   DG Chest 2 View Result Date: 10/22/2023 CLINICAL DATA:  Intermittent chest pain for two weeks. Shortness of breath. Nausea. EXAM: CHEST - 2 VIEW COMPARISON:  04/23/2019 FINDINGS: The heart size and mediastinal contours are within normal limits. Both lungs are clear. The visualized skeletal structures are unremarkable. IMPRESSION: No active cardiopulmonary disease. Electronically Signed   By: Marlyce Sine M.D.   On: 10/22/2023 16:51    Review Of Systems Constitutional: No fever, chills, chronic weight gain. Eyes: No vision change, wears glasses. No discharge or pain. Ears: No hearing loss, No tinnitus. Respiratory: No asthma, COPD, pneumonias. Positive shortness of breath. No hemoptysis. Cardiovascular: Positive chest pain, no palpitation, no leg edema. Gastrointestinal: No nausea, vomiting, diarrhea, constipation.  No GI bleed. No hepatitis. Genitourinary: No dysuria, hematuria, kidney stone. No incontinance. Neurological: No headache, stroke, seizures.  Psychiatry: No psych facility admission for anxiety, depression, suicide. No detox. Skin: No rash. Musculoskeletal: No joint pain, fibromyalgia. No neck pain, back pain. Lymphadenopathy: No lymphadenopathy. Hematology: No anemia or easy bruising.   Blood pressure (!) 147/80, pulse 86, temperature 99.1 F (37.3 C), resp. rate 18, height 5\' 11"  (1.803 m), weight (!) 147.4 kg, SpO2 100%. Body mass index is 45.33 kg/m. General appearance: alert, cooperative, appears stated age and no distress Head: Normocephalic, atraumatic. Eyes: Brown eyes, pink conjunctiva, corneas clear.  Neck: No adenopathy, no carotid bruit, no JVD, supple, symmetrical, trachea midline and thyroid not enlarged. Resp: Clear to auscultation bilaterally. Cardio: Regular rate and rhythm, S1, S2 normal, II/VI systolic murmur, no click, rub or gallop GI: Soft, non-tender; bowel sounds normal; no organomegaly. Extremities: No edema, cyanosis or clubbing. Skin: Warm and dry.  Neurologic: Alert and oriented X  3, normal strength. Normal coordination.  Assessment/Plan Acute coronary syndrome Hypertension R/O HOCM Morbid obesity Syncope  Plan: Place in observation Monitor. Echocardiogram. Stress test followed by cardiac cath if needed as IP or OP.  Time spent: Review of old records, Lab, x-rays, EKG, other cardiac tests, examination, discussion with patient/Family/PA/Nurse over 70 minutes.  Darrold Emms, MD  10/22/2023, 7:56 PM

## 2023-10-22 NOTE — ED Triage Notes (Signed)
 Chest pain sob and nausea fro 1 week  no cardiac history

## 2023-10-22 NOTE — ED Provider Triage Note (Addendum)
 Emergency Medicine Provider Triage Evaluation Note  Louis King , a 38 y.o. male  was evaluated in triage. Complains of CP, shob, nausea, and pain. Sought ed eval today when he started having abd pain and decided to get everything checked out  lower abd pain following sneezing and felt a pop or squeeze. Was 10/10 now a 3/10. No NVD, fevers. Last bm this morning and normal.  intermittent CP one week ago, Started suddenly one week ago with diaphoresis, nausea, shob while at rest. Worsens with exertion. One week ago, has syncopal episode with prodrome. Was on ground on knee when he started feeling poorly and rolled down. Believes he had syncopal episode for 2-3 minutes. Witnessed by 69yr old son.. no fmhx of cardiac hx  Currently has chest "discomfort" with no pain. No current shob. No hx of clot, recent sx or travel, swelling in legs, hemoptysis (PERC negative)  Review of Systems  Positive: See hpi Negative:   Physical Exam  BP (!) 170/107   Pulse 77   Temp 99.1 F (37.3 C)   Resp 18   Ht 5\' 11"  (1.803 m)   Wt (!) 147.4 kg   SpO2 100%   BMI 45.33 kg/m  Gen:   Awake, no distress   Resp:  Normal effort  MSK:   Moves extremities without difficulty  Other:  RLQ TTP. Nasal congestion  Medical Decision Making  Medically screening exam initiated at 3:59 PM.  Appropriate orders placed.  Rudolpho Claxton was informed that the remainder of the evaluation will be completed by another provider, this initial triage assessment does not replace that evaluation, and the importance of remaining in the ED until their evaluation is complete.  Labs, cxr, ct abd ordered   Royann Cords, PA 10/22/23 1607    Royann Cords, PA 10/22/23 [phone]

## 2023-10-22 NOTE — ED Provider Notes (Signed)
 [facility] Provider Note   CSN: [id number] Arrival date & time: 10/22/23  1513     History  Chief Complaint  Patient presents with   Chest Pain    Louis King is a 38 y.o. male with past medical history significant for hypertension, anxiety who presents concern for multiple complaints, around 1 week ago and patient had an episode of chest pain, shortness of breath, nausea, some left shoulder pain, he reports also having a syncopal episode.  Today with sharp right lower quadrant pain that was very sudden onset lasted for about 30 minutes, has since mostly resolved although he is still tender when pressing in the left lower quadrant.  He denies any nausea, vomiting, diarrhea, does endorse some constipation chronically.  Has not previously seen cardiologist, denies history of diabetes, previous stroke, ACS, strong family history of heart attack.   Chest Pain      Home Medications Prior to Admission medications   Medication Sig Start Date End Date Taking? Authorizing Provider  acetaminophen  (TYLENOL ) 650 MG CR tablet Take 650 mg by mouth 2 (two) times daily as needed for pain.     [provider]  Camphor-Menthol-Methyl Sal (TIGER BALM MUSCLE RUB) 07-19-13 % CREA Apply 1 application topically at bedtime as needed (for muscle pain).    [provider]  cyclobenzaprine (FLEXERIL) 10 MG tablet Take 10 mg by mouth at bedtime. 04/19/19   [provider]  ibuprofen  (ADVIL ) 800 MG tablet Take 1 tablet (800 mg total) by mouth every 8 (eight) hours as needed for mild pain. 04/23/19   Ward, Clover Dao, DO      Allergies    Flexeril [cyclobenzaprine]    Review of Systems   Review of Systems  Cardiovascular:  Positive for chest pain.  All other systems reviewed and are negative.   Physical Exam Updated Vital Signs BP (!) 147/80   Pulse 86   Temp 99.1 F (37.3 C)   Resp 18   Ht 5\' 11"  (1.803 m)   Wt (!) 147.4 kg   SpO2  100%   BMI 45.33 kg/m  Physical Exam Vitals and nursing note reviewed.  Constitutional:      General: He is not in acute distress.    Appearance: Normal appearance.  HENT:     Head: Normocephalic and atraumatic.  Eyes:     General:        Right eye: No discharge.        Left eye: No discharge.  Cardiovascular:     Rate and Rhythm: Normal rate and regular rhythm.     Heart sounds: No murmur heard.    No friction rub. No gallop.  Pulmonary:     Effort: Pulmonary effort is normal.     Breath sounds: Normal breath sounds.  Chest:     Comments: Focal tenderness to palpation of the left upper chest wall is reproducible.  No other significant chest tenderness. Abdominal:     General: Bowel sounds are normal.     Palpations: Abdomen is soft.     Comments: Focal right lower quadrant tenderness, positive McBurney point tenderness, no rebound, rigidity, guarding throughout.  Skin:    General: Skin is warm and dry.     Capillary Refill: Capillary refill takes less than 2 seconds.  Neurological:     Mental Status: He is alert and oriented to person, place, and time.  Psychiatric:        Mood and  Affect: Mood normal.        Behavior: Behavior normal.     ED Results / Procedures / Treatments   Labs (all labs ordered are listed, but only abnormal results are displayed) Labs Reviewed  BASIC METABOLIC PANEL WITH GFR - Abnormal; Notable for the following components:      Result Value   Glucose, Bld 103 (*)    All other components within normal limits  CBC - Abnormal; Notable for the following components:   RBC 5.84 (*)    All other components within normal limits  TROPONIN I (HIGH SENSITIVITY) - Abnormal; Notable for the following components:   Troponin I (High Sensitivity) 21 (*)    All other components within normal limits  TROPONIN I (HIGH SENSITIVITY) - Abnormal; Notable for the following components:   Troponin I (High Sensitivity) 24 (*)    All other components within normal  limits    EKG EKG Interpretation Date/Time:  Saturday October 22 2023 18:03:57 EDT Ventricular Rate:  84 PR Interval:  164 QRS Duration:  101 QT Interval:  392 QTC Calculation: 464 R Axis:   10  Text Interpretation: Sinus rhythm Probable left atrial enlargement Left ventricular hypertrophy Since last tracing of earlier today No significant change since last tracing Confirmed by Celesta Coke ([id number]) on 10/22/2023 7:17:16 PM  Radiology CT ABDOMEN PELVIS W CONTRAST Result Date: 10/22/2023 CLINICAL DATA:  Right lower quadrant abdominal pain EXAM: CT ABDOMEN AND PELVIS WITH CONTRAST TECHNIQUE: Multidetector CT imaging of the abdomen and pelvis was performed using the standard protocol following bolus administration of intravenous contrast. RADIATION DOSE REDUCTION: This exam was performed according to the departmental dose-optimization program which includes automated exposure control, adjustment of the mA and/or kV according to patient size and/or use of iterative reconstruction technique. CONTRAST:  75mL OMNIPAQUE  IOHEXOL  350 MG/ML SOLN COMPARISON:  CT 04/22/2021 FINDINGS: Lower chest: No acute abnormality. Hepatobiliary: Unremarkable liver. Normal gallbladder. No biliary dilation. Pancreas: Unremarkable. Spleen: Unremarkable. Adrenals/Urinary Tract: Normal adrenal glands. No urinary calculi or hydronephrosis. Bladder is unremarkable. Stomach/Bowel: Normal caliber large and small bowel. No bowel wall thickening. The appendix is normal.Stomach is within normal limits. Vascular/Lymphatic: No significant vascular findings are present. No enlarged abdominal or pelvic lymph nodes. Reproductive: Unremarkable. Other: No free intraperitoneal fluid or air. Left greater than right fat containing inguinal hernias. Musculoskeletal: No acute fracture. IMPRESSION: 1. No acute abnormality in the abdomen or pelvis. Normal appendix. 2. Left greater than right fat containing inguinal hernias. Electronically Signed   By:  Rozell Cornet M.D.   On: 10/22/2023 18:58   DG Chest 2 View Result Date: 10/22/2023 CLINICAL DATA:  Intermittent chest pain for two weeks. Shortness of breath. Nausea. EXAM: CHEST - 2 VIEW COMPARISON:  04/23/2019 FINDINGS: The heart size and mediastinal contours are within normal limits. Both lungs are clear. The visualized skeletal structures are unremarkable. IMPRESSION: No active cardiopulmonary disease. Electronically Signed   By: Marlyce Sine M.D.   On: 10/22/2023 16:51    Procedures Procedures    Medications Ordered in ED Medications  iohexol  (OMNIPAQUE ) 350 MG/ML injection 75 mL (75 mLs Intravenous Contrast Given 10/22/23 1840)    ED Course/ Medical Decision Making/ A&P                                 Medical Decision Making   This patient is a 38 y.o. male  who presents to the ED for concern of  syncope, chest pain starting a week ago, as well as rlq pain with coughing today.   Differential diagnoses prior to evaluation: The emergent differential diagnosis includes, but is not limited to,  The causes of generalized abdominal pain include but are not limited to AAA, mesenteric ischemia, appendicitis, diverticulitis, DKA, gastritis, gastroenteritis, AMI, nephrolithiasis, pancreatitis, peritonitis, adrenal insufficiency,lead poisoning, iron toxicity, intestinal ischemia, constipation, UTI,SBO/LBO, splenic rupture, biliary disease, IBD, IBS, PUD, or hepatitis, CVA, ACS, arrhythmia, vasovagal / orthostatic hypotension, sepsis, hypoglycemia, electrolyte disturbance, respiratory failure, anemia, dehydration, heat injury, polypharmacy, malignancy, anxiety/panic attack.  . This is not an exhaustive differential.   Past Medical History / Co-morbidities / Social History: Hypertension, obesity, anxiety, HLD  Additional history: Chart reviewed. Pertinent results include: Reviewed lab work, imaging from previous emergency department visits  Physical Exam: Physical exam performed. The  pertinent findings include: He has been somewhat hypertensive in the emergency department, blood pressure 170/107 on arrival, somewhat improved to 147/80 on recheck.  Vital signs otherwise stable.  He does have some reproducible left shoulder/left upper chest tenderness, additionally with some focal right lower quadrant tenderness, no palpable hernia noted on abdominal exam.  Lab Tests/Imaging studies: I personally interpreted labs/imaging and the pertinent results include: CBC unremarkable, BMP unremarkable, initial troponin elevated at 21, with delta at 24.  I dependently curetted CT abdomen pelvis with contrast which shows left greater than right inguinal hernias, plain, chest x-ray with no evidence of acute intrathoracic abnormality.. I agree with the radiologist interpretation.  Cardiac monitoring: EKG obtained and interpreted by myself and attending physician which shows: Normal sinus rhythm, left ventricular hypertrophy, no acute ST-T changes   Consults: Spoke with the cardiologist, Dr. Sharyn Deforest, who after description of his multiple episodes of syncope, mildly elevated troponin, hypertrophy on EKG recommends admission for overnight observation and echo.  Patient informed of plan and agrees to plan.  Will plan for direct cardiology admission.   Disposition: After consideration of the diagnostic results and the patients response to treatment, I feel that patient would benefit from admission for high risk syncope workup as described above .  Final Clinical Impression(s) / ED Diagnoses Final diagnoses:  None    Rx / DC Orders ED Discharge Orders     None         Nelly Banco, PA-C 10/22/23 1952    Kingsley, Victoria K, DO 10/22/23 2300

## 2023-10-22 NOTE — ED Triage Notes (Signed)
 He has hypertension but does not take any medicines

## 2023-10-23 ENCOUNTER — Observation Stay (HOSPITAL_COMMUNITY)

## 2023-10-23 LAB — ECHOCARDIOGRAM COMPLETE
AR max vel: 3.44 cm2
AV Area VTI: 3.51 cm2
AV Area mean vel: 3.47 cm2
AV Mean grad: 7 mmHg
AV Peak grad: 11.6 mmHg
Ao pk vel: 1.7 m/s
Area-P 1/2: 3.27 cm2
Height: 71 in
S' Lateral: 4.3 cm
Weight: 5200 [oz_av]

## 2023-10-23 LAB — CBC
HCT: 42 % (ref 39.0–52.0)
Hemoglobin: 13.6 g/dL (ref 13.0–17.0)
MCH: 26.6 pg (ref 26.0–34.0)
MCHC: 32.4 g/dL (ref 30.0–36.0)
MCV: 82 fL (ref 80.0–100.0)
Platelets: 289 10*3/uL (ref 150–400)
RBC: 5.12 MIL/uL (ref 4.22–5.81)
RDW: 14.6 % (ref 11.5–15.5)
WBC: 6.2 10*3/uL (ref 4.0–10.5)
nRBC: 0 % (ref 0.0–0.2)

## 2023-10-23 LAB — BASIC METABOLIC PANEL WITH GFR
Anion gap: 8 (ref 5–15)
BUN: 14 mg/dL (ref 6–20)
CO2: 26 mmol/L (ref 22–32)
Calcium: 9.2 mg/dL (ref 8.9–10.3)
Chloride: 103 mmol/L (ref 98–111)
Creatinine, Ser: 1.19 mg/dL (ref 0.61–1.24)
GFR, Estimated: >60 mL/min (ref >60)
Glucose, Bld: 112 mg/dL — ABNORMAL HIGH (ref 70–99)
Potassium: 3.7 mmol/L (ref 3.5–5.1)
Sodium: 137 mmol/L (ref 135–145)

## 2023-10-23 LAB — LIPID PANEL
Cholesterol: 221 mg/dL — ABNORMAL HIGH (ref 0–200)
HDL: 25 mg/dL — ABNORMAL LOW (ref >40)
LDL Cholesterol: 164 mg/dL — ABNORMAL HIGH (ref 0–99)
Total CHOL/HDL Ratio: 8.8 ratio
Triglycerides: 159 mg/dL — ABNORMAL HIGH (ref <150)
VLDL: 32 mg/dL (ref 0–40)

## 2023-10-23 LAB — HEPARIN LEVEL (UNFRACTIONATED)
Heparin Unfractionated: 0.21 [IU]/mL — ABNORMAL LOW (ref 0.30–0.70)
Heparin Unfractionated: 0.31 [IU]/mL (ref 0.30–0.70)

## 2023-10-23 MED ORDER — LOSARTAN POTASSIUM 50 MG PO TABS: 50.0000 mg | ORAL_TABLET | Freq: Every day | ORAL | 3 refills | Status: AC

## 2023-10-23 MED ORDER — ATORVASTATIN CALCIUM 40 MG PO TABS: 40.0000 mg | ORAL_TABLET | Freq: Every day | ORAL | 3 refills | Status: AC

## 2023-10-23 MED ORDER — NITROGLYCERIN 0.4 MG SL SUBL: 0.4000 mg | SUBLINGUAL_TABLET | SUBLINGUAL | 0 refills | Status: AC | PRN

## 2023-10-23 MED ORDER — LOSARTAN POTASSIUM 50 MG PO TABS: 50.0000 mg | ORAL_TABLET | Freq: Every day | ORAL | Status: DC

## 2023-10-23 MED ORDER — HEPARIN BOLUS VIA INFUSION
1000.0000 [IU] | Freq: Once | INTRAVENOUS | Status: AC
  Administered 2023-10-23: 1000 [IU] via INTRAVENOUS
  Filled 2023-10-23: qty 1000

## 2023-10-23 MED ORDER — METOPROLOL SUCCINATE ER 25 MG PO TB24: 50.0000 mg | ORAL_TABLET | Freq: Every day | ORAL | Status: DC

## 2023-10-23 MED ORDER — METOPROLOL SUCCINATE ER 50 MG PO TB24: 50.0000 mg | ORAL_TABLET | Freq: Every day | ORAL | 3 refills | Status: AC

## 2023-10-23 NOTE — Progress Notes (Signed)
  Echocardiogram 2D Echocardiogram has been performed.  Louis King 10/23/2023, 9:25 AM

## 2023-10-23 NOTE — Discharge Summary (Signed)
 Physician Discharge Summary  Patient ID: Louis King MRN: [id number] DOB/AGE: 1985/08/20 38 y.o.  Admit date: 10/22/2023 Discharge date: 10/23/2023  Admission Diagnoses: Acute coronary syndrome Hypertension R/O HOCM Morbid obesity Syncope  Discharge Diagnoses:  Principal Problem:   Acute coronary syndrome (HCC) Active problems: Hypertensive heart disease Hypertrophic cardiomyopathy Hyperlipidemia Morbid obesity Syncope Anxiety Mild MR, TR, AI and PI  Discharged Condition: fair  Hospital Course: 38 years old black male with PMH of untreated Hypertension (known to the patient for at least 2 years) and Morbid Obesity has episodes of chest pain, shortness of breath, nausea and one episode of syncope. He admits to heavy duty work x 1 week. His EKG shows sinus rhythm with LVH. Troponin I levels are minimally elevated x 2. CBC and BMET are near normal. CXR is unremarkable. CT of abdomen is without acute abnormality. Echocardiogram shows moderate LVH without obstruction and mild AI, MR, TR and PI. He was started on Losartan and metoprolol succinate. Diuretic was avoided due to significant LVH. He preferred OP TMST over inpatient stress test.  Patient understood to follow low salt, low fat diet and increase activity. He will see me in 1 week for TMST and see primary care in 1-2 weeks.  Consults: cardiology  Significant Diagnostic Studies: labs: Near normal CBC, BMET and Troponin I. EKG with sinus rhythm and LVH. Echocardiogram with moderate LVH, mild MR, TR, AI and PI. CXR: unremarkable. CT abdomen: Unremarkable.  Treatments: cardiac meds: Atorvastatin, metoprolol and Losartan.  Discharge Exam: Blood pressure (!) 153/94, pulse 76, temperature 98.1 F (36.7 C), temperature source Oral, resp. rate 19, height 5\' 11"  (1.803 m), weight (!) 147.4 kg, SpO2 96%. General appearance: alert, cooperative and appears stated age. Head: Normocephalic, atraumatic. Eyes: Brown eyes, pink  conjunctiva, corneas clear.   Neck: No adenopathy, no carotid bruit, no JVD, supple, symmetrical, trachea midline and thyroid not enlarged. Resp: Clear to auscultation bilaterally. Cardio: Regular rate and rhythm, S1, S2 normal, II/VI systolic murmur, no click, rub or gallop. GI: Soft, non-tender; bowel sounds normal; no organomegaly. Extremities: No edema, cyanosis or clubbing. Skin: Warm and dry.  Neurologic: Alert and oriented X 3, normal strength and tone. Normal coordination and gait.  Disposition: Discharge disposition: 01-Home or Self Care        Allergies as of 10/23/2023       Reactions   Flexeril [cyclobenzaprine] Other (See Comments)   Caused lethargy        Medication List     TAKE these medications    atorvastatin 40 MG tablet Commonly known as: LIPITOR Take 1 tablet (40 mg total) by mouth daily. Start taking on: October 24, 2023   losartan 50 MG tablet Commonly known as: COZAAR Take 1 tablet (50 mg total) by mouth daily. Start taking on: October 24, 2023   metoprolol succinate 50 MG 24 hr tablet Commonly known as: TOPROL-XL Take 1 tablet (50 mg total) by mouth daily. Start taking on: October 24, 2023   nitroGLYCERIN 0.4 MG SL tablet Commonly known as: NITROSTAT Place 1 tablet (0.4 mg total) under the tongue every 5 (five) minutes x 3 doses as needed for chest pain.        Follow-up Information     Benedetto Brady, MD .   Specialty: Family Medicine Contact information: [address] [place] [address] [phone]         Pasqual Bone, MD Follow up in 1 week(s).   Specialty: Cardiology Contact information: [address]  [address] [place] [address] [phone]                 Time spent: Review of old chart, current chart, lab, x-ray, cardiac tests and discussion with patient over 60 minutes.  Signed: Darrold Emms 10/23/2023, 11:37 AM

## 2023-10-23 NOTE — ED Notes (Signed)
 Phlebotomy at bedside collecting heparin level.

## 2023-10-23 NOTE — Progress Notes (Addendum)
 ANTICOAGULATION CONSULT NOTE  Pharmacy Consult for Heparin Indication: chest pain/ACS  Allergies  Allergen Reactions   Flexeril [Cyclobenzaprine] Other (See Comments)    Caused lethargy    Patient Measurements: Height: 5\' 11"  (180.3 cm) Weight: (!) 147.4 kg (325 lb) IBW/kg (Calculated) : 75.3 Heparin Dosing Weight: 110.1 kg  Vital Signs: Temp: 98.1 F (36.7 C) (06/08 1041) Temp Source: Oral (06/08 1041) BP: 153/94 (06/08 1000) Pulse Rate: 76 (06/08 1000)  Labs: Recent Labs    10/22/23 1552 10/22/23 1823 10/23/23 0251  HGB 15.3  --  13.6  HCT 48.1  --  42.0  PLT 319  --  289  HEPARINUNFRC  --   --  0.21*  CREATININE 1.10  --  1.19  TROPONINIHS 21* 24*  --     Estimated Creatinine Clearance: 123.9 mL/min (by C-G formula based on SCr of 1.19 mg/dL).   Medical History: Past Medical History:  Diagnosis Date   Hypertension    Assessment: 44 yom with a history of HTN, obesity. Patient is presenting with chest pain. Heparin per pharmacy consult placed for chest pain/ACS.  Patient is not on anticoagulation prior to arrival.   6/8 0251: HL 0.21 on 1500 units/hr >> 1000 unit bolus given and increased to 1700 units/hr 6/8 1000: HL 0.31 - Level is borderline therapeutic.  Per RN, no signs/symptoms of bleeding or issues with heparin gtt running continuously.  Goal of Therapy:  Heparin level 0.3-0.7 units/ml Monitor platelets by anticoagulation protocol: Yes   Plan:  No bolus needed at this time Slightly increase heparin infusion to 1750 units/hr Check anti-Xa level in 6 hours and daily while on heparin Continue to monitor H&H and platelets  Dionicio Fray, PharmD, BCPS 10/23/2023 11:09 AM ED Clinical Pharmacist -  [phone]

## 2023-10-23 NOTE — ED Notes (Signed)
Transported to echo

## 2023-10-23 NOTE — Progress Notes (Signed)
 ANTICOAGULATION CONSULT NOTE  Pharmacy Consult for Heparin Indication: chest pain/ACS  Allergies  Allergen Reactions   Flexeril [Cyclobenzaprine] Other (See Comments)    Caused lethargy    Patient Measurements: Height: 5\' 11"  (180.3 cm) Weight: (!) 147.4 kg (325 lb) IBW/kg (Calculated) : 75.3 Heparin Dosing Weight: 110.1 kg  Vital Signs: Temp: 98.3 F (36.8 C) (06/08 0256) Temp Source: Oral (06/07 2249) BP: 156/96 (06/07 2250) Pulse Rate: 83 (06/08 0225)  Labs: Recent Labs    10/22/23 1552 10/22/23 1823 10/23/23 0251  HGB 15.3  --  13.6  HCT 48.1  --  42.0  PLT 319  --  289  HEPARINUNFRC  --   --  0.21*  CREATININE 1.10  --   --   TROPONINIHS 21* 24*  --     Estimated Creatinine Clearance: 134.1 mL/min (by C-G formula based on SCr of 1.1 mg/dL).   Medical History: Past Medical History:  Diagnosis Date   Hypertension    Assessment: 28 yom with a history of HTN, obesity. Patient is presenting with chest pain. Heparin per pharmacy consult placed for chest pain/ACS.  Patient is not on anticoagulation prior to arrival.   AM: heparin level subtherapeutic on 1500 units/hr. Per RN, no signs/symptoms of bleeding or issues with heparin gtt running continuously  Goal of Therapy:  Heparin level 0.3-0.7 units/ml Monitor platelets by anticoagulation protocol: Yes   Plan:  Bolus 1000 units IV x1 Increase heparin infusion to 1700 units/hr Check anti-Xa level in 6 hours and daily while on heparin Continue to monitor H&H and platelets  Young Hensen, PharmD, BCPS Clinical Pharmacist 10/23/2023 3:22 AM

## 2023-10-24 LAB — LIPOPROTEIN A (LPA): Lipoprotein (a): 88.7 nmol/L — ABNORMAL HIGH (ref <75.0)
